# Patient Record
Sex: Male | Born: 1985 | Race: White | Hispanic: No | Marital: Single | Smoking: Current every day smoker
Health system: Southern US, Community
[De-identification: ages and names within clinical notes are randomized; demographics above are authoritative.]

## PROBLEM LIST (undated history)

## (undated) DIAGNOSIS — G8929 Other chronic pain: Secondary | ICD-10-CM

## (undated) DIAGNOSIS — M549 Dorsalgia, unspecified: Secondary | ICD-10-CM

---
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
## 2012-07-29 ENCOUNTER — Emergency Department (HOSPITAL_COMMUNITY)
Admission: EM | Admit: 2012-07-29 | Discharge: 2012-07-29 | Disposition: A | Discharge status: Home / Self Care | Payer: Self-pay | Attending: Emergency Medicine | Admitting: Emergency Medicine

## 2012-07-29 ENCOUNTER — Emergency Department (HOSPITAL_COMMUNITY): Payer: Self-pay

## 2012-07-29 ENCOUNTER — Encounter (HOSPITAL_COMMUNITY): Payer: Self-pay | Admitting: Emergency Medicine

## 2012-07-29 DIAGNOSIS — M5430 Sciatica, unspecified side: Secondary | ICD-10-CM | POA: Insufficient documentation

## 2012-07-29 DIAGNOSIS — X500XXA Overexertion from strenuous movement or load, initial encounter: Secondary | ICD-10-CM | POA: Insufficient documentation

## 2012-07-29 DIAGNOSIS — Y9289 Other specified places as the place of occurrence of the external cause: Secondary | ICD-10-CM | POA: Insufficient documentation

## 2012-07-29 DIAGNOSIS — Y990 Civilian activity done for income or pay: Secondary | ICD-10-CM | POA: Insufficient documentation

## 2012-07-29 DIAGNOSIS — F17200 Nicotine dependence, unspecified, uncomplicated: Secondary | ICD-10-CM | POA: Insufficient documentation

## 2012-07-29 DIAGNOSIS — IMO0002 Reserved for concepts with insufficient information to code with codable children: Secondary | ICD-10-CM | POA: Insufficient documentation

## 2012-07-29 DIAGNOSIS — Y9389 Activity, other specified: Secondary | ICD-10-CM | POA: Insufficient documentation

## 2012-07-29 HISTORY — DX: Other chronic pain: G89.29

## 2012-07-29 HISTORY — DX: Dorsalgia, unspecified: M54.9

## 2012-07-29 MED ORDER — HYDROCODONE-ACETAMINOPHEN 5-325 MG PO TABS: 1.0000 | ORAL_TABLET | ORAL | Status: DC | PRN

## 2012-07-29 MED ORDER — PREDNISONE 10 MG PO TABS: 20.0000 mg | ORAL_TABLET | Freq: Two times a day (BID) | ORAL | Status: DC
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
## 2012-07-29 NOTE — ED Notes (Signed)
Pt c/o lower back pain that radiates down both legs x1 month. Pt states "my legs feel heavy and the pain becomes worse when I walk".

## 2012-07-30 NOTE — ED Provider Notes (Signed)
Medical screening examination/treatment/procedure(s) were performed by non-physician practitioner and as supervising physician I was immediately available for consultation/collaboration.   Carleene Cooper III, MD 07/30/12 [phone]

## 2013-12-27 IMAGING — CR DG LUMBAR SPINE COMPLETE 4+V
5 series · 5 of 5 positions shown · non-contrast
Comparison: None.

CLINICAL DATA: Mid to low back pain

LUMBAR SPINE - COMPLETE 4+ VIEW

[view not recorded (1 of 5)]
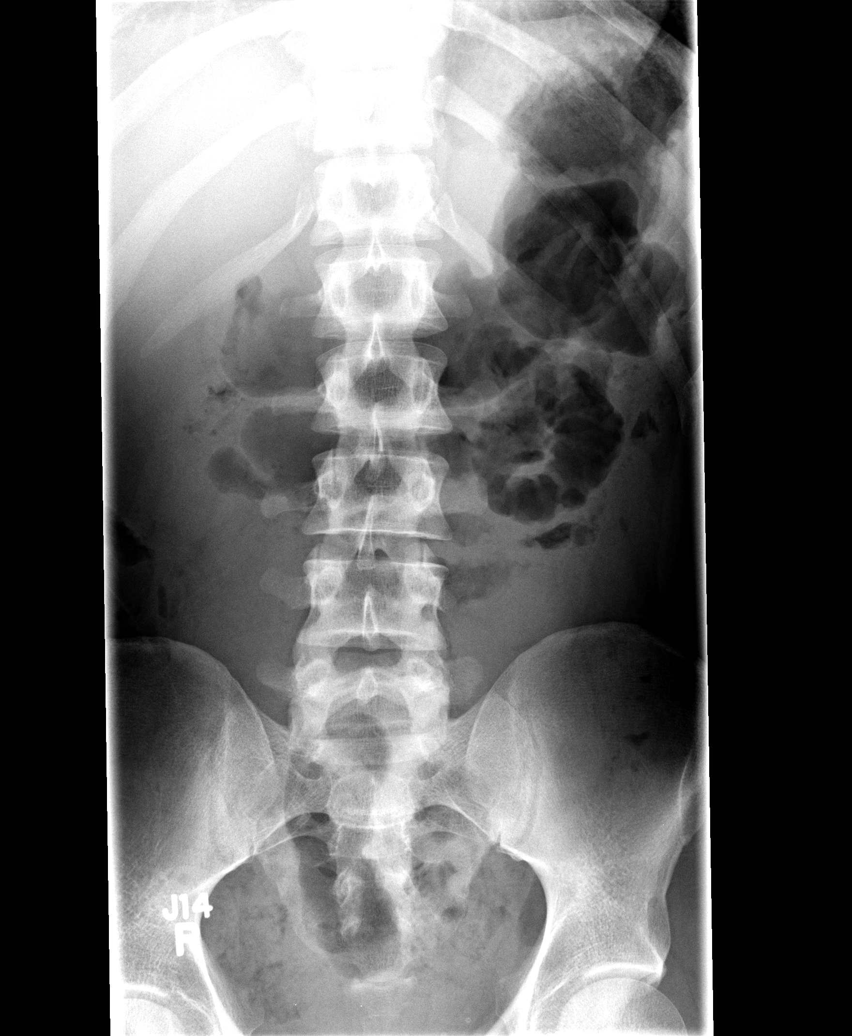

[view not recorded (2 of 5)]
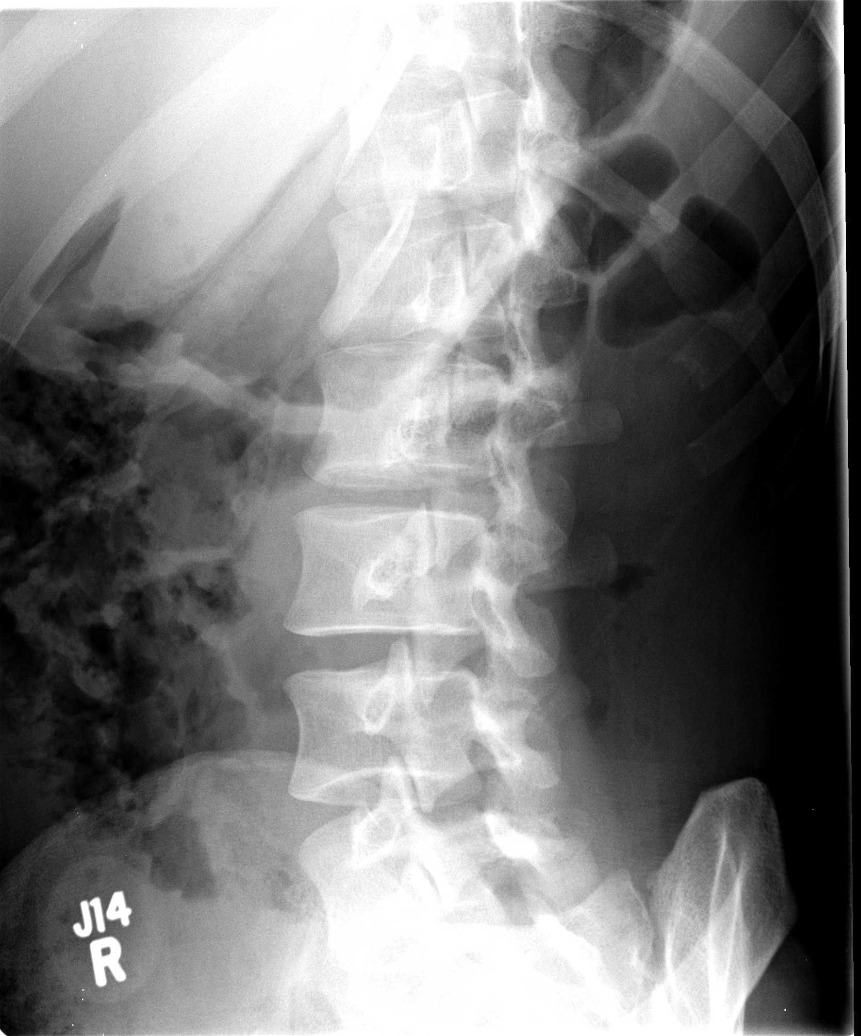

[view not recorded (3 of 5)]
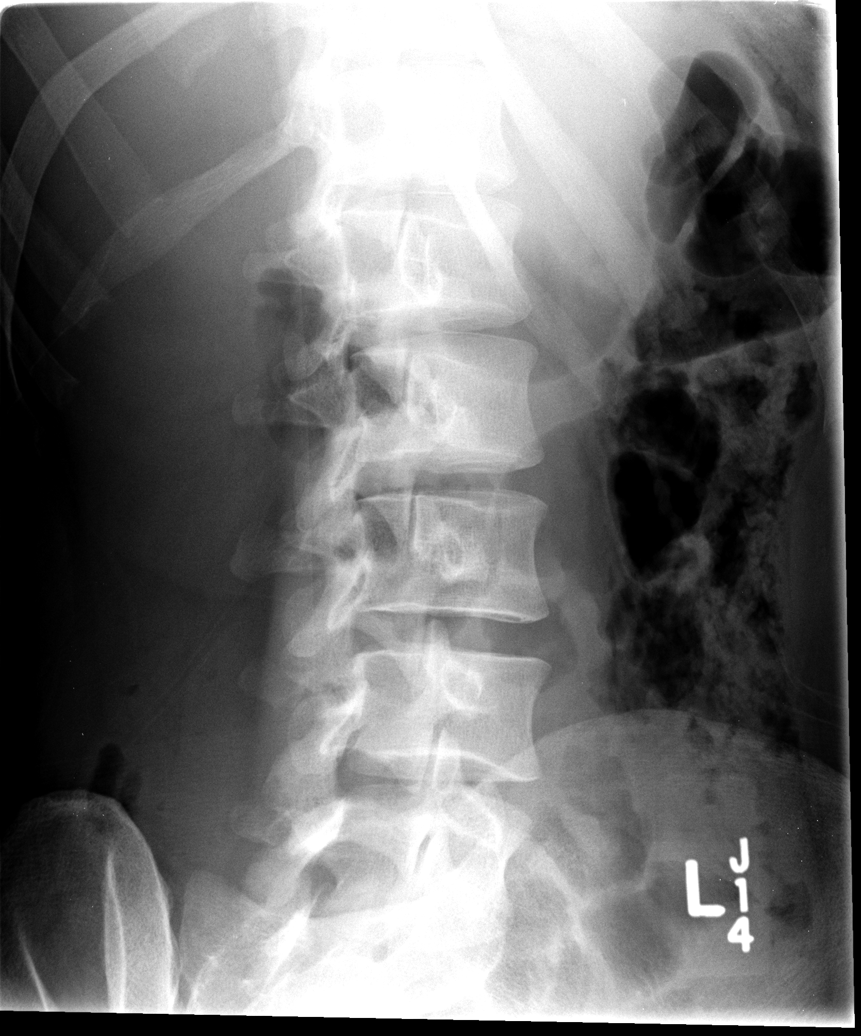

[view not recorded (4 of 5)]
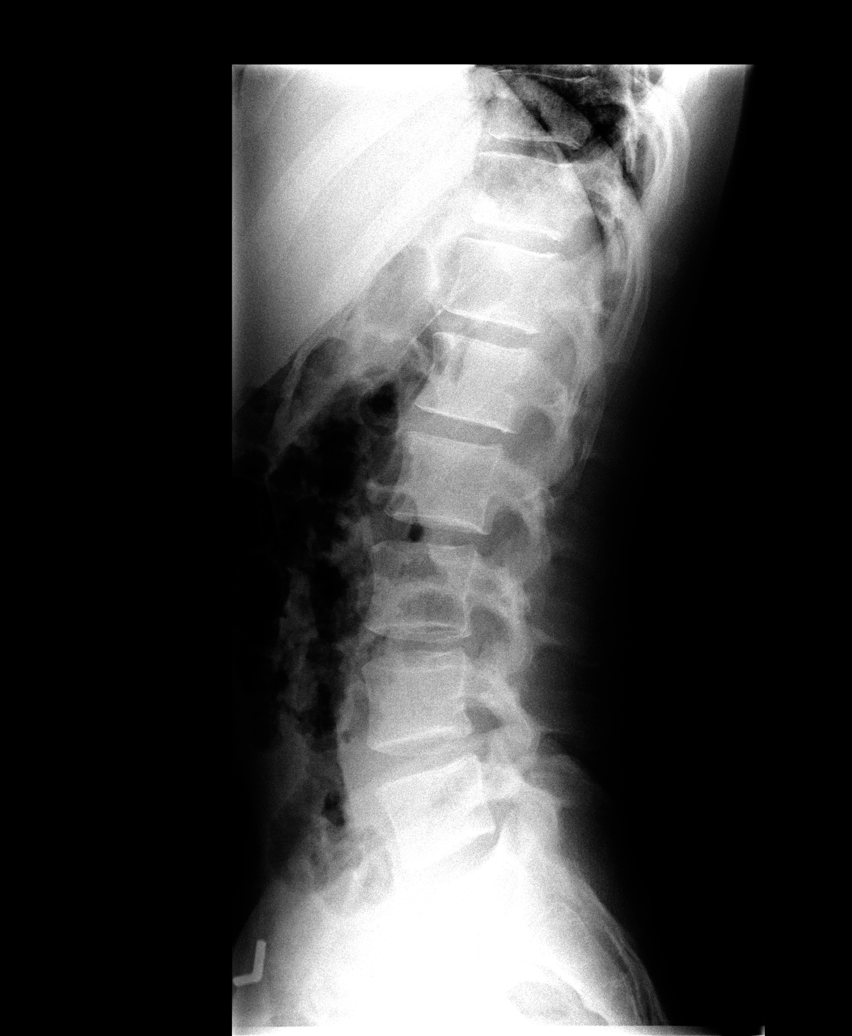

[view not recorded (5 of 5)]
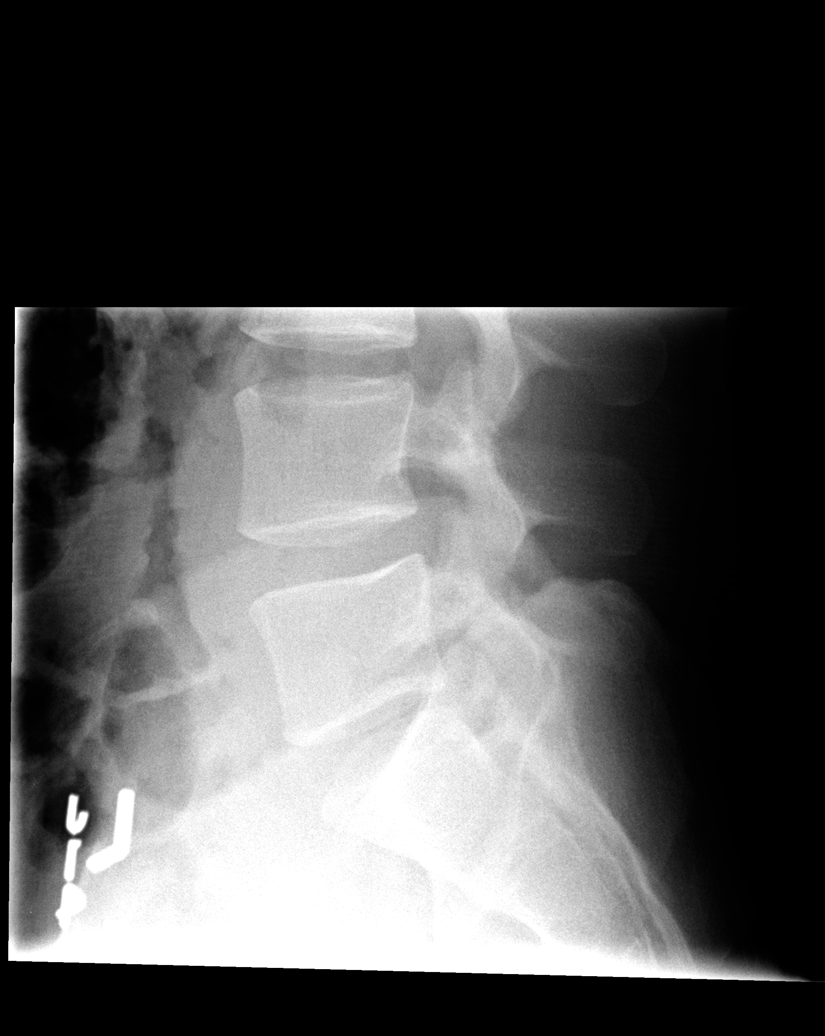

[5 of 5 positions shown; findings below may reference images not displayed]

FINDINGS: There is no evidence of lumbar spine fracture.
Alignment is normal.  Intervertebral disc spaces are maintained.
IMPRESSION: Negative.

## 2015-05-10 ENCOUNTER — Other Ambulatory Visit: Payer: Self-pay | Admitting: *Deleted

## 2016-06-09 ENCOUNTER — Encounter (HOSPITAL_COMMUNITY): Payer: Self-pay | Admitting: *Deleted

## 2016-06-09 ENCOUNTER — Emergency Department (HOSPITAL_COMMUNITY)
Admission: EM | Admit: 2016-06-09 | Discharge: 2016-06-09 | Disposition: A | Discharge status: Home / Self Care | Payer: Self-pay | Attending: Emergency Medicine | Admitting: Emergency Medicine

## 2016-06-09 DIAGNOSIS — F17210 Nicotine dependence, cigarettes, uncomplicated: Secondary | ICD-10-CM | POA: Insufficient documentation

## 2016-06-09 DIAGNOSIS — T401X1A Poisoning by heroin, accidental (unintentional), initial encounter: Secondary | ICD-10-CM | POA: Insufficient documentation

## 2016-06-09 NOTE — Discharge Instructions (Signed)
Substance Abuse Treatment Programs ° °Intensive Outpatient Programs °[facility]     °[address]      °[place], [place]                   °[phone]      ° °[facility] °[address] °[place], [place] °[phone] ° °[facility]     °(Inpatient and outpatient)     °[address]           °[phone]   ° °[facility] °336-288-1484 (Suboxone and Methadone) ° °[address]      °[place], [place] [address]      °336-882-2125      ° °[address] °[place], [place] °852-3033 ° °[facility] (Outpatient/Inpatient, Chemical)    °(insurance only) [phone]      °       °[facility] (Groups & Residential) °[place], [place] °[phone] ° °   °[facility]     °[address]     °[place], [place]      °[phone]      ° °[facility] (for caregivers and family) °[address] °[place], [place] °336-299-4655 ° ° ° ° ° °Residential Treatment Programs °[facility]      °[address], [place], [place] [address]  °[phone]      ° °[facility] °[address] [place], [place] [address] °[phone] ° °[facility]        °336-248-8914      ° °[facility] °1-800-659-3381 ° °[facility]             °[address]                                         °[place], [place]                                                °877-615-2722 or 336-784-9470                              ° °[facility] [place] °[address] °[place], [address] °1.877.941.8954 ° °[facility]    °[address]      °[place], [place]     °[phone]      ° °[facility] °[address] °, [place] °336-285-9073 ° °[facility]   °[address]     °[place], [place] [address]     °[phone]      °Admissions: 8am-3pm M-F ° °[facility] °[address] °[place],  [place] °336-227-7417 ° °[facility]: Residential Program (90 Days)   °[place], [place]      °336-725-8389 or 800-758-6077    ° °[facility] °[place],  °(Walk in Hours over the weekend or by referral) ° °[facility] °[address], [place],  [address] °([phone] ° °Crisis Mobile: [facility]:  1-877-626-1772 (for crisis response 24 hours a day) °[facility] Hotline:      1-800-256-2452 °Outpatient Psychiatry and Counseling ° °[facility]: Mobile Crisis   Management 24 hours:  1-877-626-1772 ° °[facility] sliding scale fee and walk in schedule: M-F 8am-12pm/1pm-3pm °[address]  °[place], [place] [address] °[phone] ° °[facility] °[address] °[place], [place] [address] °336-703-9650 ° °[facility] ([facility])- new patient walk-in appointments available Monday - Friday 8am -3pm.          °[address] °[place], [place] [address] °336-676-6840 or crisis line- 336-676-6905 ° °[facility] Intensive Outpatient Therapy Program °[address] °[place], [place] [address] °336-832-9804 ° °[facility]                  °Crisis Services      °[phone]      °[address]     °[place], [place] [address]                ° °[facility]   °[facility] °[phone] °[address] °[place], [place] [address] ° ° °[facility]          °[address]  °[place], [place] [address]       °([phone] ° °[facility] °[address] °[place], [place] [address] °336-292-1510 ° °[facility]    °[address]    °[place], [place] [address]     °[phone]      ° °Parish McKinney, [place]     °[address]     °[place] [address]     °336-282-1251     °  °[facility] °[address] °[place] [address] ° °[facility]     °[address]     °[place], [place]      °336-542-2076      ° °[facility] °Shamsher Ahluwalia, MD °[address] °[place], [place] [address] °336-420-9558 ° °[facility]     °[address]     °[place], [place] [address]     °336-274-1237      ° °[facility] °[address] °[place], [place] [address] °336-854-4450 °Resources for Temporary Residential Assistance/Crisis Centers ° °DAY CENTERS °[facility] °M-F 8am-3pm   °[address] [place], [place] [address]   336-332-0824 °Services include: laundry, barbering, support groups, case management, phone  & computer access, showers, AA/NA mtgs, mental health/substance abuse nurse, job skills class, disability information, VA assistance, spiritual classes, etc.  ° °HOMELESS SHELTERS ° ° [facility]     °[facility]   °[address], [place]     °[phone]       °       °[facility] (women and children)       °[address] °, [place] [address] °336-275-0820 °[email or web address] for application and process °Application Required ° °[facility]   °[address]    °[place] [address]     °336.886.4922       °             °[facility] °[address] °, [place] [address] °336.273.5572 °336-235-0363(schedule application appt.) °Application Required ° °[facility] (women only)    °[address]     °[place],  [address]     °336-884-1039      °  Intake starts 6pm daily °Need valid ID, SSC, & Police report °[facility] [place] °[address] °[place], [place] °336-881-5420 °Application Required ° °[facility] (men only)     °[address]      °[place], [place]     °[phone]      ° °[facility] °(Pregnant women only) °[address] °[place], [place] °336-275-0206 ° °[facility]      °[address]      °[place], [place] [address]     °336-722-9951      °       °[facility] °[address] °[place], [place] °336-723-1848 °90 day commitment/SA/Application process ° °[facility](men only)     °[address]     °[place], [place]     °[phone]       °Check-in at 7pm     °       °[facility] °[address] °[place], [place] [address] °336-248-6684 °Men/Women/Women and Children must be there by 7 pm ° °[facility] °[place], [place] °336-722-8721                ° °

## 2016-06-09 NOTE — ED Provider Notes (Signed)
[facility]-EMERGENCY DEPT Provider Note   CSN: [id number] Arrival date & time: 06/09/16  1249     History   Chief Complaint Chief Complaint  Patient presents with  . Drug Overdose    IV Heroin    HPI Jon Hawkins is a 31 y.o. male.  HPI  The pt is a 31 y/o male - hx of drug use - has used perc/ / vicodin in the past - snorting - he has used IVD intermittently over time as well - heroin every couple of weeks - last used this morning from a stash that he found at home.  He was in the car with his father driving to the [employer] to pick up some items and the father came out to the car and found him unresponsive. He pulled him out of the car, gave him mouth-to-mouth and CPR for a short time, the paramedics arrived and gave Narcan which immediately restore the patient's vital signs and mental status. Since that time the patient has been awake, alert, does not complain of chest pain shortness breath back pain fevers or any other complaints. He denies trying to kill himself, states that he was just trying to use drugs to get high  Past Medical History:  Diagnosis Date  . Chronic back pain     There are no active problems to display for this patient.   History reviewed. No pertinent surgical history.     Home Medications    Prior to Admission medications   Not on File    Family History History reviewed. No pertinent family history.  Social History Social History  Substance Use Topics  . Smoking status: Current Every Day Smoker    Packs/day: 0.50    Years: 6.00    Types: Cigarettes  . Smokeless tobacco: Never Used  . Alcohol use No     Allergies   Bee venom   Review of Systems Review of Systems  All other systems reviewed and are negative.    Physical Exam Updated Vital Signs Ht 5\' 8"  (1.727 m)   Wt 145 lb (65.8 kg)   SpO2 97%   BMI 22.05 kg/m   Physical Exam  Constitutional: He appears well-developed and well-nourished. No distress.  HENT:  Head:  Normocephalic and atraumatic.  Mouth/Throat: Oropharynx is clear and moist. No oropharyngeal exudate.  Eyes: Conjunctivae and EOM are normal. Pupils are equal, round, and reactive to light. Right eye exhibits no discharge. Left eye exhibits no discharge. No scleral icterus.  Neck: Normal range of motion. Neck supple. No JVD present. No thyromegaly present.  Cardiovascular: Regular rhythm, normal heart sounds and intact distal pulses.  Exam reveals no gallop and no friction rub.   No murmur heard. Mild tachycardia  Pulmonary/Chest: Effort normal and breath sounds normal. No respiratory distress. He has no wheezes. He has no rales.  Abdominal: Soft. Bowel sounds are normal. He exhibits no distension and no mass. There is no tenderness.  Musculoskeletal: Normal range of motion. He exhibits no edema or tenderness.  Lymphadenopathy:    He has no cervical adenopathy.  Neurological: He is alert. Coordination normal.  Skin: Skin is warm and dry. No rash noted. No erythema.  Psychiatric: He has a normal mood and affect. His behavior is normal.  Nursing note and vitals reviewed.    ED Treatments / Results  Labs (all labs ordered are listed, but only abnormal results are displayed) Labs Reviewed - No data to display  EKG  EKG Interpretation  Date/Time:  Sunday Jun 09 2016 12:50:17 EDT Ventricular Rate:  128 PR Interval:    QRS Duration: 102 QT Interval:  320 QTC Calculation: 467 R Axis:   103 Text Interpretation:  Sinus tachycardia Borderline right axis deviation ST elev, probable normal early repol pattern No old tracing to compare Confirmed by Saesha Llerenas  MD, Quatavious Rossa ([id number]) on 06/09/2016 2:22:19 PM       Radiology No results found.  Procedures Procedures (including critical care time)  Medications Ordered in ED Medications - No data to display   Initial Impression / Assessment and Plan / ED Course  I have reviewed the triage vital signs and the nursing notes.  Pertinent labs &  imaging results that were available during my care of the patient were reviewed by me and considered in my medical decision making (see chart for details).     No specific findings of concern on exam, normal mental status, normal neurologic exam, thankfully after the Narcan the patient has maintained a normal mental status and his stay here for over an hour and a half suggesting that he will not have a decompensation. I will give him resources for the community, he has accepted these, the father is in agreement and will help guide him into these resources.  Observed for > 1 hour - no decompensation Resources given Stable for d/c.   Final Clinical Impressions(s) / ED Diagnoses   Final diagnoses:  Accidental overdose of heroin, initial encounter    New Prescriptions New Prescriptions   No medications on file     Eber HongMiller, Deberah Adolf, MD 06/09/16 1527

## 2016-06-09 NOTE — ED Triage Notes (Signed)
Pt found by father unresponsive in car by father.  Father started CPR and called 911.  EMS report pt with pulse of 6, 13% O2 sat, and no respirations.  2mg  narcan administered on scene. Pt recovered conciousness on scene with 130/84 BP, 121 HR, 15 RR and 192 CBG.  Patient is now alert, calm and wanting to stop drug use.

## 2020-06-04 DEATH — deceased
